# Patient Record
Sex: Female | Born: 2000
Health system: Southern US, Community
[De-identification: ages and names within clinical notes are randomized; demographics above are authoritative.]

---
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
## 2020-02-02 DIAGNOSIS — R030 Elevated blood-pressure reading, without diagnosis of hypertension: Secondary | ICD-10-CM | POA: Diagnosis not present

## 2020-02-03 DIAGNOSIS — R030 Elevated blood-pressure reading, without diagnosis of hypertension: Secondary | ICD-10-CM | POA: Insufficient documentation

## 2020-02-22 DIAGNOSIS — Z309 Encounter for contraceptive management, unspecified: Secondary | ICD-10-CM | POA: Diagnosis not present

## 2020-02-22 DIAGNOSIS — N926 Irregular menstruation, unspecified: Secondary | ICD-10-CM | POA: Diagnosis not present

## 2020-02-22 DIAGNOSIS — D509 Iron deficiency anemia, unspecified: Secondary | ICD-10-CM | POA: Diagnosis not present

## 2020-02-22 DIAGNOSIS — Z3202 Encounter for pregnancy test, result negative: Secondary | ICD-10-CM | POA: Diagnosis not present

## 2020-04-03 DIAGNOSIS — J069 Acute upper respiratory infection, unspecified: Secondary | ICD-10-CM | POA: Diagnosis not present

## 2020-05-30 ENCOUNTER — Ambulatory Visit (INDEPENDENT_AMBULATORY_CARE_PROVIDER_SITE_OTHER): Payer: BC Managed Care – PPO | Admitting: Sports Medicine

## 2020-05-30 ENCOUNTER — Other Ambulatory Visit: Payer: Self-pay

## 2020-05-30 ENCOUNTER — Encounter: Payer: Self-pay | Admitting: Sports Medicine

## 2020-05-30 VITALS — BP 132/82 | Ht 63.0 in | Wt 172.0 lb

## 2020-05-30 DIAGNOSIS — M2242 Chondromalacia patellae, left knee: Secondary | ICD-10-CM | POA: Diagnosis not present

## 2020-05-30 NOTE — Progress Notes (Signed)
   Subjective:    Patient ID: Julie Robinson, female    DOB: 2000/10/13, 19 y.o.   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  She plays the shortstop position.  She denies any known injury.  She describes a general achy discomfort in the anterior knee which is most noticeable when holding the knee in a deep flexed position.  She does get some radiating numbness and tingling into her foot as well.  Symptoms improve if she straightens out her knee.  She has not noticed any swelling.  No feelings of instability.  No significant injuries to this knee in the past.  No prior knee surgeries.  She is home for winter break.  Past medical history reviewed Medications reviewed Allergies reviewed    Review of Systems As above    Objective:   Physical Exam  Well-developed, well-nourished.  No acute distress.  Awake alert and oriented x3.  Vital signs reviewed  Left knee: Full range of motion.  No effusion.  No patellofemoral crepitus.  No pain with patellofemoral grind.  She does have a tight lateral retinaculum.  Knee is stable to valgus and varus stressing.  Negative anterior drawer, negative posterior drawer.  No tenderness along medial or lateral joint lines.  Negative McMurray's.  Neurovascularly intact distally.  Limited bedside ultrasound of the left knee shows no knee effusion.  Visualized portion of the medial and lateral menisci are unremarkable.      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  Patient will start a home exercise program concentrating on VMO strengthening.  She may take this information back to [facility] for the athletic trainers there.  If symptoms persist or worsen, consider formal physical therapy.  Follow-up for ongoing or recalcitrant issues.

## 2020-05-30 NOTE — Patient Instructions (Addendum)
Knee Pain (Chondromalacia Patella)  Chondromalacia patella (knee pain) is the softening and breakdown of the tissue (cartilage) on the underside of the kneecap (patella). Pain results when the knee and the thigh bone (femur) rub together. Dull, aching pain and/or a feeling of grinding when the knee is flexed may occur. The most common way to treat symptoms of chondromalacia patella is to rest the knee.  What is chondromalacia patella? Chondromalacia patella (knee pain) is the softening and breakdown of the tissue (cartilage) on the underside of the kneecap (patella). Pain results when the knee and the thigh bone (femur) rub together.  Who is at risk for chondromalacia patella? People who are at risk for developing chondromalacia patella include:  Those who are overweight People who have had an injury, fracture, or dislocation related to the kneecap Runners, soccer players, bicyclists, and other people who exercise often Teenagers and healthy young adults, more often females  SYMPTOMS AND CAUSES What causes chondromalacia patella? Chondromalacia patella often occurs when the undersurface of the kneecap comes in contact with the thigh bone causing swelling and pain. Abnormal knee cap positioning, tightness or weakness of the muscles associated with the knee, too much activity involving the knee, and flat feet may increase the likelihood of chondromalacia patella.  What are the symptoms of chondromalacia patella? Dull, aching pain that is felt:  Behind the kneecap Below the kneecap On the sides of the kneecap A feeling of grinding when the knee is flexed may occur. This can happen: Doing knee bends Going down stairs Running down hill Standing up after sitting for awhile  DIAGNOSIS AND TESTS How is chondromalacia patella diagnosed? A doctor will perform a physical examination of the knee to determine the cause of pain. If the diagnosis is not clear or symptoms do not improve a doctor may  order one of the following:  Blood tests and/or a standard knee X-ray - This may help to rule out some types of arthritis or inflammation. MRI scan- A tests that shows details of the knee joint and can reveal many cases of chondromalacia patella. Arthroscopy - A tiny, flexible camera is inserted into the knee to see exactly what the cartilage looks like.  MANAGEMENT AND TREATMENT How is chondromalacia patella treated? The most common way to treat symptoms of chondromalacia patella is to rest the knee. Other ways to treat the symptoms include:  Placing of an ice or cold pack to the area for 15-20 minutes, four times daily, for several days. Do not apply ice directly to the skin. Wrap the ice or cold pack with a towel. Nonsteroidal anti-inflammatory drugs (NSAIDs) for pain relief--These include ibuprofen, naproxen, and aspirin. Topical pain medication-- These include creams or patches that are applied to the skin to help with soft tissue pain. Prescription pain relievers. Other treatments or self-care include:  Changing the way you exercise Doing exercises to both stretch and strengthen the quadriceps and hamstring muscles Losing weight (if you need to) Using special shoe inserts and support devices Taping to realign the kneecap Wearing the right kind of sport or running shoes  OUTLOOK / PROGNOSIS What is the prognosis (outlook) for chondromalacia patella? Individuals suffering from knee pain caused by chondromalacia patella often make a full recovery. Recovery can be as fast as a month or take years, depending on the case. Many long-term recoveries occur in teenagers because their bones are still growing. Symptoms tend to disappear once adulthood is reached.

## 2020-06-09 DIAGNOSIS — Z1152 Encounter for screening for COVID-19: Secondary | ICD-10-CM | POA: Diagnosis not present

## 2020-10-23 ENCOUNTER — Ambulatory Visit: Payer: Self-pay

## 2020-10-23 ENCOUNTER — Ambulatory Visit (INDEPENDENT_AMBULATORY_CARE_PROVIDER_SITE_OTHER): Payer: BC Managed Care – PPO | Admitting: Family Medicine

## 2020-10-23 ENCOUNTER — Ambulatory Visit
Admission: RE | Admit: 2020-10-23 | Discharge: 2020-10-23 | Disposition: A | Discharge status: Home / Self Care | Payer: BC Managed Care – PPO | Source: Ambulatory Visit | Attending: Family Medicine | Admitting: Family Medicine

## 2020-10-23 ENCOUNTER — Other Ambulatory Visit: Payer: Self-pay

## 2020-10-23 VITALS — BP 134/82 | Ht 62.0 in | Wt 165.0 lb

## 2020-10-23 DIAGNOSIS — S9032XA Contusion of left foot, initial encounter: Secondary | ICD-10-CM | POA: Diagnosis not present

## 2020-10-23 DIAGNOSIS — M25572 Pain in left ankle and joints of left foot: Secondary | ICD-10-CM | POA: Diagnosis not present

## 2020-10-23 DIAGNOSIS — M25522 Pain in left elbow: Secondary | ICD-10-CM | POA: Diagnosis not present

## 2020-10-23 DIAGNOSIS — S9002XA Contusion of left ankle, initial encounter: Secondary | ICD-10-CM | POA: Diagnosis not present

## 2020-10-23 DIAGNOSIS — M7989 Other specified soft tissue disorders: Secondary | ICD-10-CM | POA: Diagnosis not present

## 2020-10-23 MED ORDER — NITROGLYCERIN 0.2 MG/HR TD PT24: MEDICATED_PATCH | TRANSDERMAL | 11 refills | Status: AC

## 2020-10-23 NOTE — Patient Instructions (Addendum)
Get x-rays when you leave today - we will call you with the results. If these are normal, you have a lateral ankle sprain. Ice the area for 15 minutes at a time, 3-4 times a day Aleve 2 tabs twice a day with food OR ibuprofen 3 tabs three times a day with food for pain and inflammation as needed. Elevate above the level of your heart when possible Crutches if needed to help with walking Bear weight when tolerated Use ankle brace when up and walking around to help with stability while you recover from this injury. Come out of the brace twice a day to do Up/down and alphabet exercises 2-3 sets of each. Consider physical therapy for strengthening and balance exercises in the future. If not improving as expected, we may repeat x-rays or consider further testing like an MRI. Follow up in 2 weeks (sooner if you're feeling much better).  You have left triceps tendinitis. Continue doing home exercises you learned from your trainer. Icing 15 minutes at a time 3-4 times a day. Sleeve during the day when you're active. Aleve or ibuprofen as above. Nitro patches 1/4th patch to affected area, change daily. Consider formal physical therapy while you're back here.

## 2020-10-23 NOTE — Progress Notes (Signed)
PCP: Doreene Eland, MD  Subjective:   HPI: Patient is a 20 y.o. female here for left ankle injury, left elbow pain.  Patient reports she was walking the dog yesterday when she got tangled up and inverted her left ankle. Able to bear weight after this. A lot of bruising. Feels like pain has worsened since then. No treatment other than some icing. Also with posterior left elbow pain for past 2-3 months. She's right handed, plays softball. Worked with [employer] before college let out for the summer. No acute injury - was doing icing, dry needling, band work, stim, wearing sleeve.  History reviewed. No pertinent past medical history.  Current Outpatient Medications on File Prior to Visit  Medication Sig Dispense Refill  . JENCYCLA 0.35 MG tablet Take 1 tablet by mouth daily.     No current facility-administered medications on file prior to visit.    History reviewed. No pertinent surgical history.  No Known Allergies  Social History   Socioeconomic History  . Marital status: Single    Spouse name: Not on file  . Number of children: Not on file  . Years of education: Not on file  . Highest education level: Not on file  Occupational History  . Not on file  Tobacco Use  . Smoking status: Never Smoker  . Smokeless tobacco: Never Used  Substance and Sexual Activity  . Alcohol use: Not on file  . Drug use: Not on file  . Sexual activity: Not on file  Other Topics Concern  . Not on file  Social History Narrative  . Not on file   Social Determinants of Health   Financial Resource Strain: Not on file  Food Insecurity: Not on file  Transportation Needs: Not on file  Physical Activity: Not on file  Stress: Not on file  Social Connections: Not on file  Intimate Partner Violence: Not on file    History reviewed. No pertinent family history.  BP 134/82   Ht 5\' 2"  (1.575 m)   Wt 165 lb (74.8 kg)   LMP 10/22/2020 (Exact Date)   BMI 30.18 kg/m   No  flowsheet data found.  No flowsheet data found.  Review of Systems: See HPI above.     Objective:  Physical Exam:  Gen: NAD, comfortable in exam room  Left ankle: Mod swelling and bruising dorsal foot. Mild limitation all motions but able to do so TTP over bruised area but also base 5th MT, anterior aspect medial malleolus.  No fibular, navicular, lateral malleolus tenderness. 2+ ant drawer and negative talar tilt.   Negative syndesmotic compression. Thompsons test negative. NV intact distally.  Left elbow: No deformity. FROM with 5/5 strength - mild pain elbow extension Tenderness to palpation triceps tendon.  No other tenderness. NVI distally. Collateral ligaments intact. Negative hook test.   Limited MSK u/s left elbow:  No effusion. Triceps tendon intact.   Left ankle: No obvious cortical irregularities of medial malleolus, base 5th MT.  Peroneus brevis intact. Assessment & Plan:  1. Left ankle injury - independently reviewed radiographs and no acute fracture.  Limited ultrasound also reassuring.  Consistent with lateral ankle sprain. Motion exercises, ASO brace, icing, aleve or ibuprofen.  F/u in 2 weeks.  2. Left elbow pain - 2/2 triceps tendinitis.  No effusion on limited ultrasound.  Icing, aleve or ibuprofen.  Home exercises reviewed.  Start nitro patches.  Consider formal PT.

## 2020-10-24 ENCOUNTER — Encounter: Payer: Self-pay | Admitting: Family Medicine

## 2020-11-08 ENCOUNTER — Ambulatory Visit (INDEPENDENT_AMBULATORY_CARE_PROVIDER_SITE_OTHER): Payer: BC Managed Care – PPO | Admitting: Family Medicine

## 2020-11-08 ENCOUNTER — Other Ambulatory Visit: Payer: Self-pay

## 2020-11-08 ENCOUNTER — Encounter: Payer: Self-pay | Admitting: Family Medicine

## 2020-11-08 VITALS — BP 120/78 | Ht 62.0 in | Wt 172.0 lb

## 2020-11-08 DIAGNOSIS — M25572 Pain in left ankle and joints of left foot: Secondary | ICD-10-CM | POA: Diagnosis not present

## 2020-11-08 DIAGNOSIS — M25522 Pain in left elbow: Secondary | ICD-10-CM | POA: Diagnosis not present

## 2020-11-08 NOTE — Progress Notes (Signed)
PCP: Doreene Eland, MD  Subjective:   HPI: Patient is a 20 y.o. female here for left ankle injury, left elbow pain.  5/16: Patient reports she was walking the dog yesterday when she got tangled up and inverted her left ankle. Able to bear weight after this. A lot of bruising. Feels like pain has worsened since then. No treatment other than some icing. Also with posterior left elbow pain for past 2-3 months. She's right handed, plays softball. Worked with [employer] before college let out for the summer. No acute injury - was doing icing, dry needling, band work, stim, wearing sleeve.  6/1: Patient reports her left ankle has improved since last visit. Swelling, bruising improved.  Walking better and doing motion exercises. Left elbow about the same - tolerating nitro patches. No new injuries.  History reviewed. No pertinent past medical history.  Current Outpatient Medications on File Prior to Visit  Medication Sig Dispense Refill  . JENCYCLA 0.35 MG tablet Take 1 tablet by mouth daily.    . nitroGLYCERIN (NITRODUR - DOSED IN MG/24 HR) 0.2 mg/hr patch 1/4th patch to affected elbow, change daily 30 patch 11   No current facility-administered medications on file prior to visit.    History reviewed. No pertinent surgical history.  No Known Allergies  Social History   Socioeconomic History  . Marital status: Single    Spouse name: Not on file  . Number of children: Not on file  . Years of education: Not on file  . Highest education level: Not on file  Occupational History  . Not on file  Tobacco Use  . Smoking status: Never Smoker  . Smokeless tobacco: Never Used  Substance and Sexual Activity  . Alcohol use: Not on file  . Drug use: Not on file  . Sexual activity: Not on file  Other Topics Concern  . Not on file  Social History Narrative  . Not on file   Social Determinants of Health   Financial Resource Strain: Not on file  Food Insecurity: Not  on file  Transportation Needs: Not on file  Physical Activity: Not on file  Stress: Not on file  Social Connections: Not on file  Intimate Partner Violence: Not on file    History reviewed. No pertinent family history.  BP 120/78   Ht 5\' 2"  (1.575 m)   Wt 172 lb (78 kg)   LMP 10/22/2020 (Exact Date)   BMI 31.46 kg/m   [facility] 11/08/2020  Frequency of aerobic exercise (# of days/week) 5  Average time in minutes 60  Frequency of strengthening activities (# of days/week) 5    No flowsheet data found.  Review of Systems: See HPI above.     Objective:  Physical Exam:  Gen: NAD, comfortable in exam room  Left ankle: No gross deformity, swelling, ecchymoses FROM Mild TTP over post tib tendon, dorsally over 5th metatarsal. Trace ant drawer and talar tilt.   Negative syndesmotic compression. Thompsons test negative. NV intact distally.  Left elbow: No deformity. FROM with 5/5 strength. Tenderness to palpation triceps tendon. NVI distally.  Assessment & Plan:  1. Left ankle injury - radiographs negative.  Limited ultrasound reassuring last visit.  Doing motion exercises.  Clinically improving.  Icing, add strengthening exercises.  F/u in 4 weeks.  2. Left elbow pain - 2/2 triceps tendinitis.  Continue nitro patches.  Add formal physical therapy.  Icing, aleve or ibuprofen.

## 2020-11-08 NOTE — Patient Instructions (Signed)
Continue the nitro patches for your triceps. Start physical therapy for this also. For your ankle start home exercises with theraband and proprioception exercises. Activities as tolerated as you continue to improve over the next 2-4 weeks. Follow up with me in 1 month.

## 2020-11-29 DIAGNOSIS — M25622 Stiffness of left elbow, not elsewhere classified: Secondary | ICD-10-CM | POA: Diagnosis not present

## 2020-11-29 DIAGNOSIS — M25522 Pain in left elbow: Secondary | ICD-10-CM | POA: Diagnosis not present

## 2020-11-29 DIAGNOSIS — R531 Weakness: Secondary | ICD-10-CM | POA: Diagnosis not present

## 2020-12-01 DIAGNOSIS — M25522 Pain in left elbow: Secondary | ICD-10-CM | POA: Diagnosis not present

## 2020-12-01 DIAGNOSIS — M25622 Stiffness of left elbow, not elsewhere classified: Secondary | ICD-10-CM | POA: Diagnosis not present

## 2020-12-01 DIAGNOSIS — R531 Weakness: Secondary | ICD-10-CM | POA: Diagnosis not present

## 2020-12-08 DIAGNOSIS — R531 Weakness: Secondary | ICD-10-CM | POA: Diagnosis not present

## 2020-12-08 DIAGNOSIS — M25622 Stiffness of left elbow, not elsewhere classified: Secondary | ICD-10-CM | POA: Diagnosis not present

## 2020-12-08 DIAGNOSIS — M25522 Pain in left elbow: Secondary | ICD-10-CM | POA: Diagnosis not present

## 2020-12-13 ENCOUNTER — Ambulatory Visit: Payer: BC Managed Care – PPO | Admitting: Family Medicine

## 2020-12-13 DIAGNOSIS — M25522 Pain in left elbow: Secondary | ICD-10-CM | POA: Diagnosis not present

## 2020-12-13 DIAGNOSIS — M25622 Stiffness of left elbow, not elsewhere classified: Secondary | ICD-10-CM | POA: Diagnosis not present

## 2020-12-13 DIAGNOSIS — R531 Weakness: Secondary | ICD-10-CM | POA: Diagnosis not present

## 2021-01-23 DIAGNOSIS — Z113 Encounter for screening for infections with a predominantly sexual mode of transmission: Secondary | ICD-10-CM | POA: Diagnosis not present

## 2021-01-23 DIAGNOSIS — Z01419 Encounter for gynecological examination (general) (routine) without abnormal findings: Secondary | ICD-10-CM | POA: Diagnosis not present

## 2021-01-23 DIAGNOSIS — Z6831 Body mass index (BMI) 31.0-31.9, adult: Secondary | ICD-10-CM | POA: Diagnosis not present

## 2021-03-28 DIAGNOSIS — M79642 Pain in left hand: Secondary | ICD-10-CM | POA: Diagnosis not present

## 2021-07-17 DIAGNOSIS — J039 Acute tonsillitis, unspecified: Secondary | ICD-10-CM | POA: Diagnosis not present

## 2021-07-26 DIAGNOSIS — J029 Acute pharyngitis, unspecified: Secondary | ICD-10-CM | POA: Diagnosis not present

## 2021-08-02 DIAGNOSIS — J020 Streptococcal pharyngitis: Secondary | ICD-10-CM | POA: Diagnosis not present

## 2021-08-31 DIAGNOSIS — S81801A Unspecified open wound, right lower leg, initial encounter: Secondary | ICD-10-CM | POA: Diagnosis not present

## 2021-10-22 DIAGNOSIS — M25522 Pain in left elbow: Secondary | ICD-10-CM | POA: Diagnosis not present

## 2022-02-18 DIAGNOSIS — Z6832 Body mass index (BMI) 32.0-32.9, adult: Secondary | ICD-10-CM | POA: Diagnosis not present

## 2022-02-18 DIAGNOSIS — J039 Acute tonsillitis, unspecified: Secondary | ICD-10-CM | POA: Diagnosis not present

## 2022-06-07 DIAGNOSIS — Z0000 Encounter for general adult medical examination without abnormal findings: Secondary | ICD-10-CM | POA: Diagnosis not present

## 2022-06-07 DIAGNOSIS — Z136 Encounter for screening for cardiovascular disorders: Secondary | ICD-10-CM | POA: Diagnosis not present

## 2022-06-07 DIAGNOSIS — N898 Other specified noninflammatory disorders of vagina: Secondary | ICD-10-CM | POA: Diagnosis not present

## 2022-06-07 DIAGNOSIS — Z01419 Encounter for gynecological examination (general) (routine) without abnormal findings: Secondary | ICD-10-CM | POA: Diagnosis not present

## 2022-06-07 DIAGNOSIS — Z1339 Encounter for screening examination for other mental health and behavioral disorders: Secondary | ICD-10-CM | POA: Diagnosis not present

## 2022-06-07 DIAGNOSIS — Z6832 Body mass index (BMI) 32.0-32.9, adult: Secondary | ICD-10-CM | POA: Diagnosis not present

## 2022-08-28 DIAGNOSIS — S93401A Sprain of unspecified ligament of right ankle, initial encounter: Secondary | ICD-10-CM | POA: Diagnosis not present

## 2022-10-14 IMAGING — DX DG ANKLE COMPLETE 3+V*L*
3 series · 3 of 3 positions shown · non-contrast
Comparison: None.

CLINICAL DATA: Recent injury with left ankle and foot pain,
swelling and bruising.

EXAM:
LEFT FOOT - COMPLETE 3+ VIEW; LEFT ANKLE COMPLETE - 3+ VIEW

[dg ankle complete left (1 of 3)]
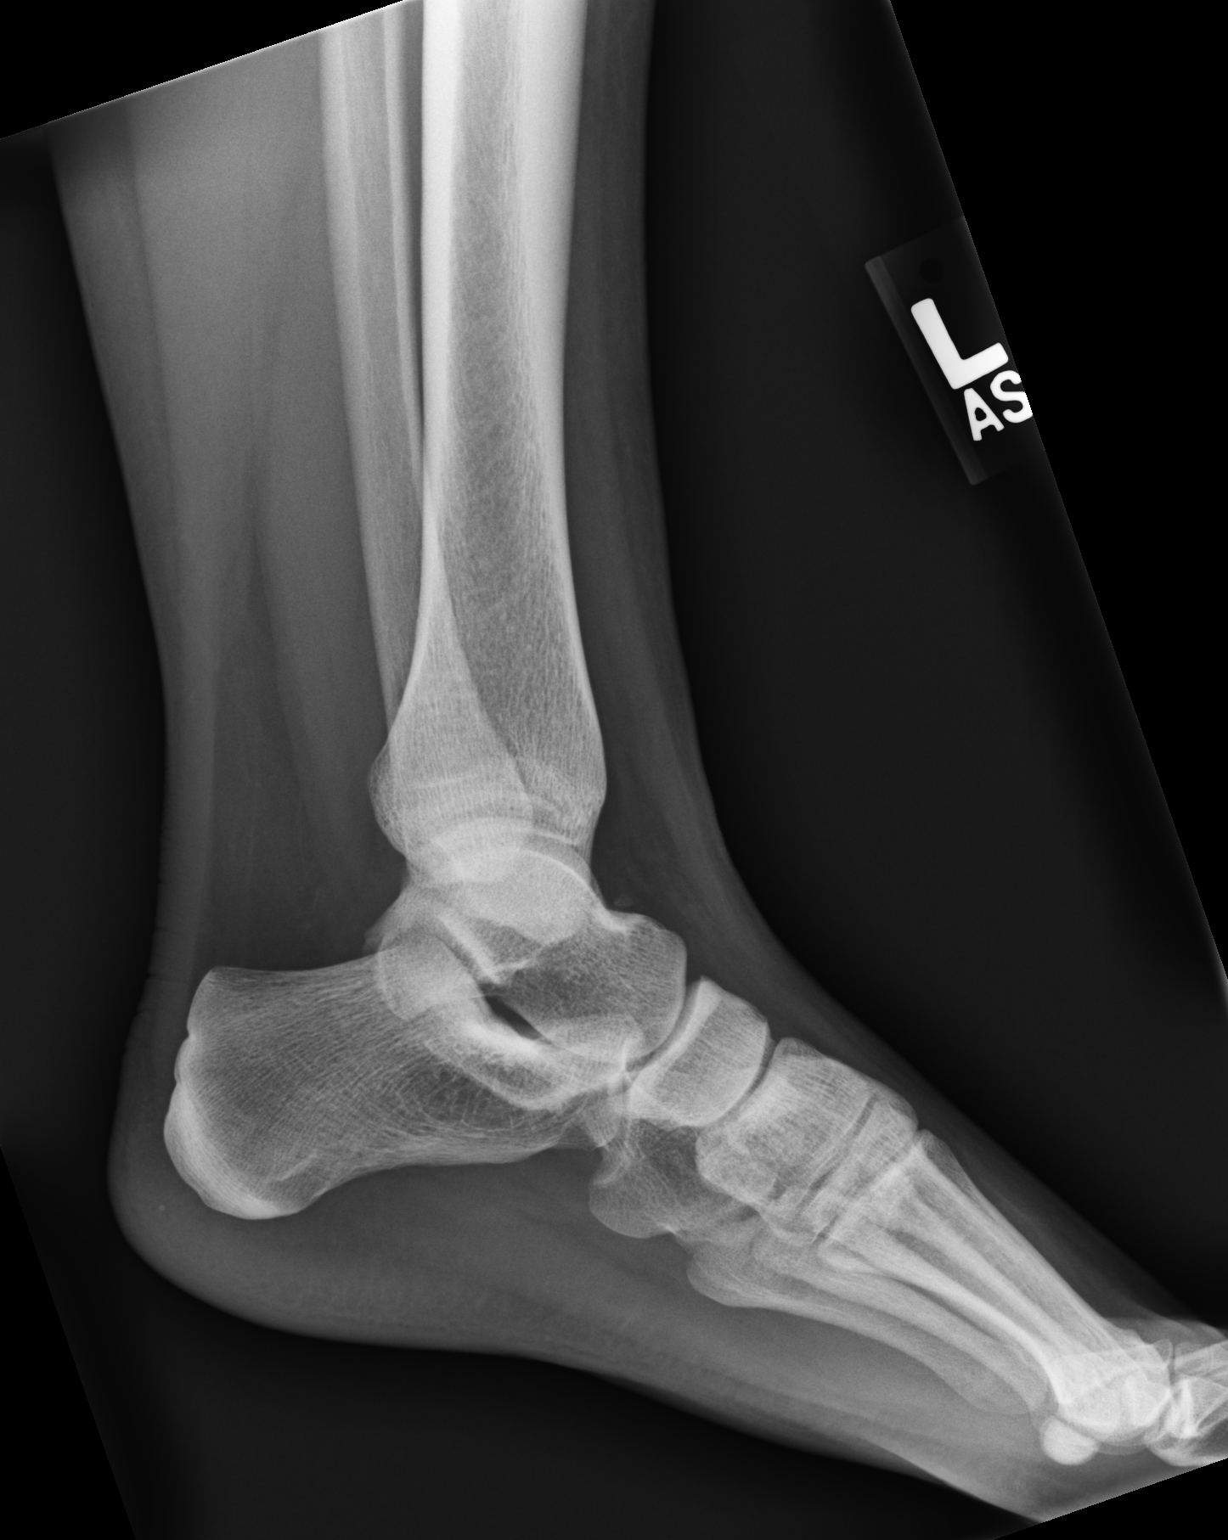

[dg ankle complete left (2 of 3)]
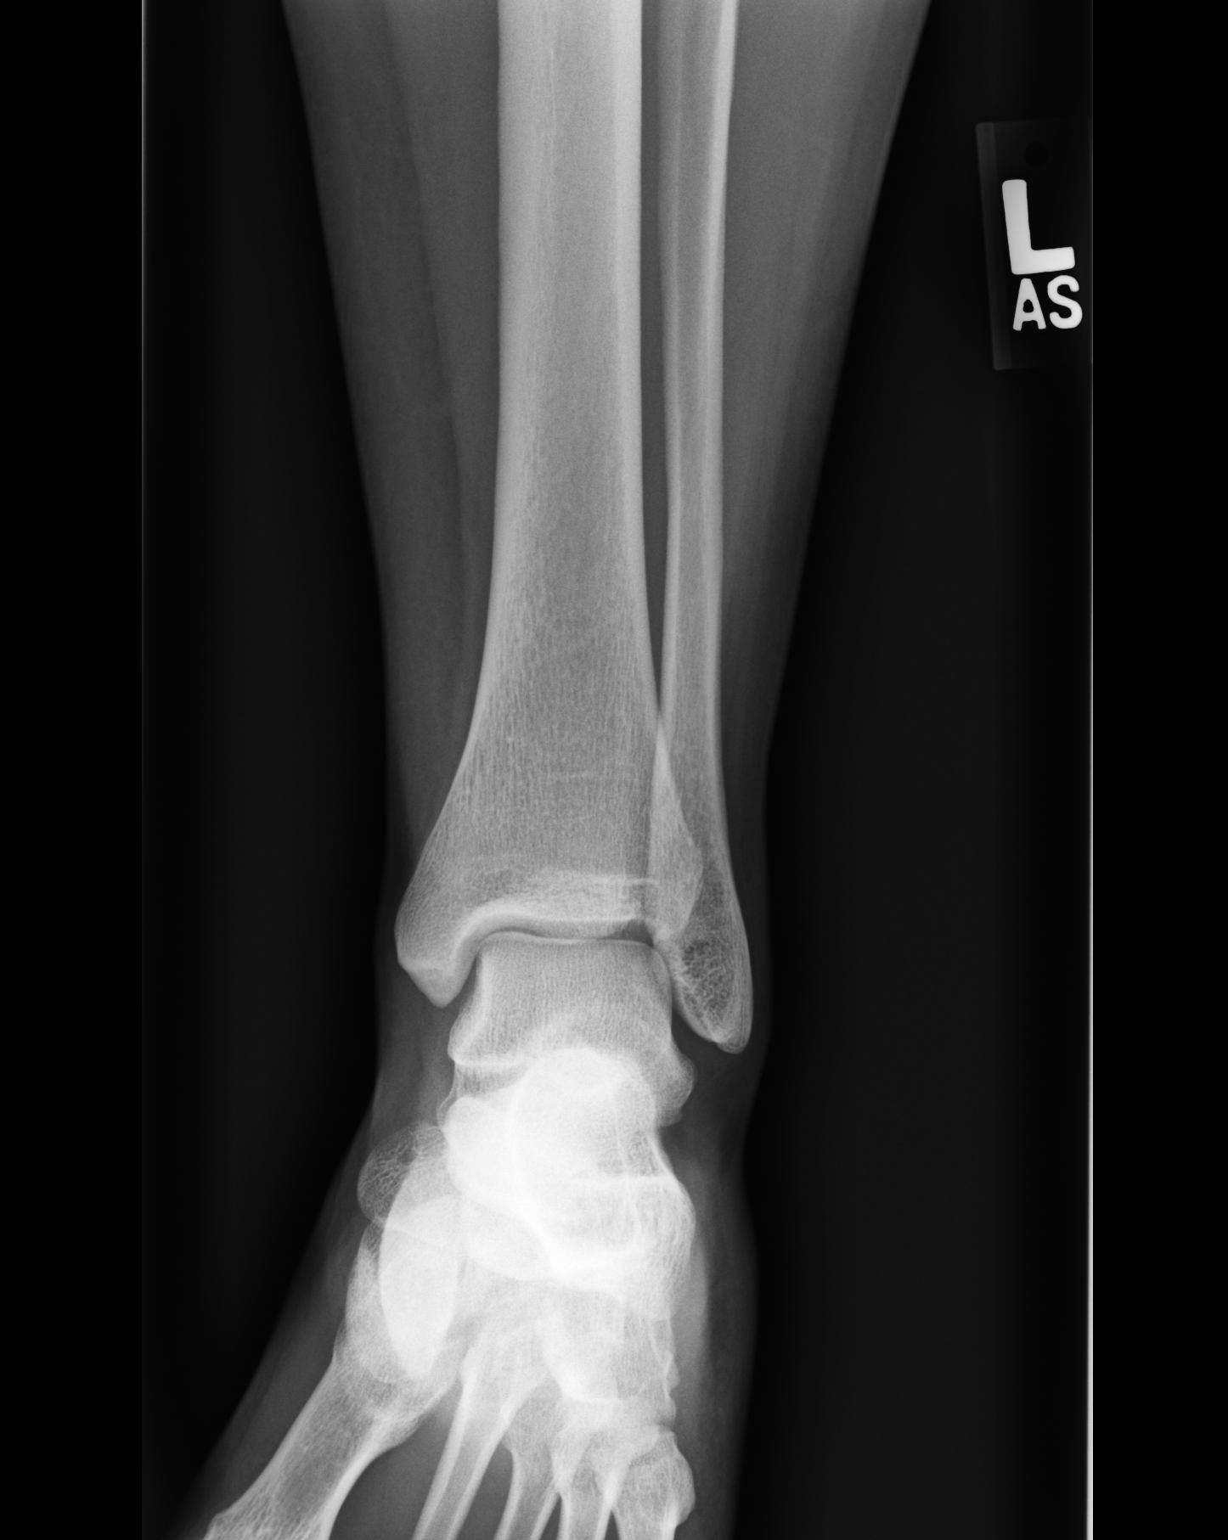

[dg ankle complete left (3 of 3)]
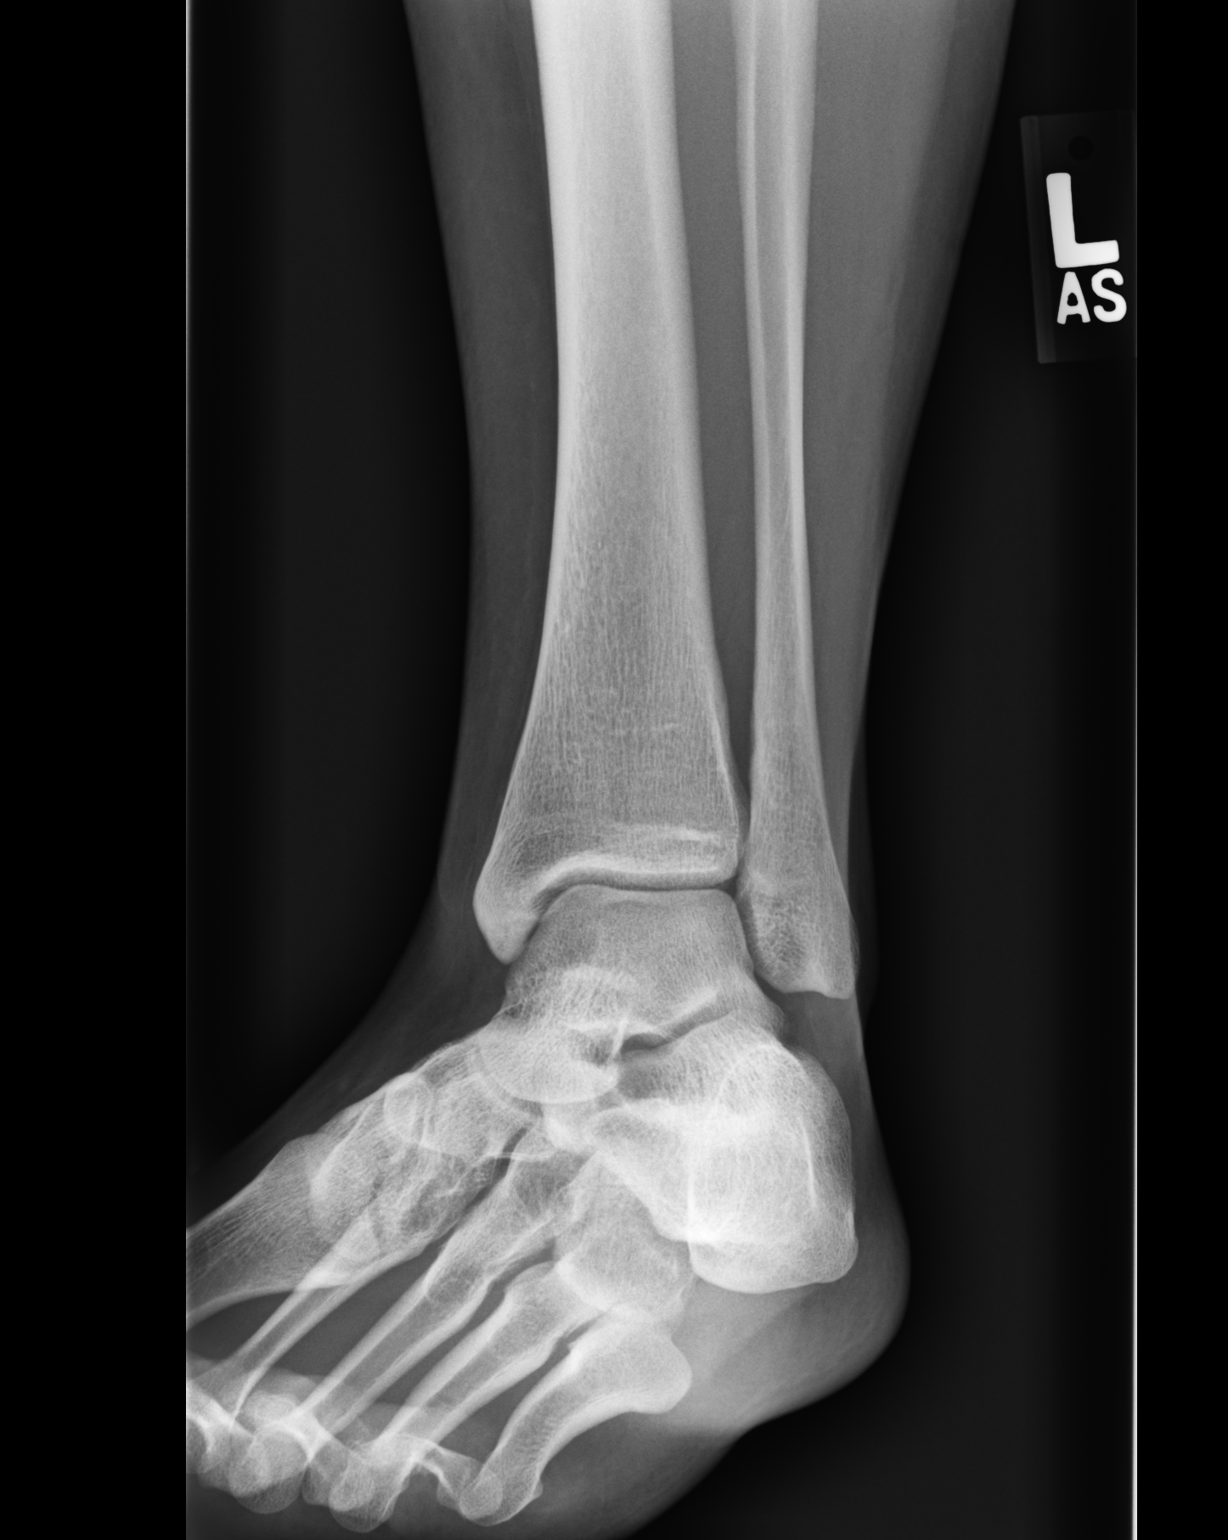

[3 of 3 positions shown; findings below may reference images not displayed]

FINDINGS: Left ankle: The mineralization and alignment are normal. There is no
evidence of acute fracture or dislocation. The joint spaces are
preserved. No focal soft tissue swelling identified.

Left foot: The mineralization and alignment are normal. There is no
evidence of acute fracture or dislocation. Small ossific density
dorsal to the talar neck does not appear acute. The joint spaces are
preserved. Unremarkable soft tissues.
IMPRESSION: No evidence of acute fracture or dislocation in the left foot or
ankle.

## 2022-12-24 DIAGNOSIS — Z3202 Encounter for pregnancy test, result negative: Secondary | ICD-10-CM | POA: Diagnosis not present

## 2022-12-24 DIAGNOSIS — Z3009 Encounter for other general counseling and advice on contraception: Secondary | ICD-10-CM | POA: Diagnosis not present
